# Patient Record
Sex: Male | Born: 1985 | Race: White | Hispanic: No | Marital: Married | Smoking: Current some day smoker
Health system: Southern US, Community
[De-identification: ages and names within clinical notes are randomized; demographics above are authoritative.]

## PROBLEM LIST (undated history)

## (undated) HISTORY — PX: HAND SURGERY: SHX662

---
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
## 2007-12-30 ENCOUNTER — Emergency Department (HOSPITAL_COMMUNITY): Admission: EM | Admit: 2007-12-30 | Discharge: 2007-12-30 | Payer: Self-pay | Admitting: Emergency Medicine

## 2008-10-02 ENCOUNTER — Emergency Department (HOSPITAL_COMMUNITY): Admission: EM | Admit: 2008-10-02 | Discharge: 2008-10-02 | Payer: Self-pay | Admitting: Emergency Medicine

## 2011-02-25 ENCOUNTER — Encounter: Payer: Self-pay | Admitting: *Deleted

## 2011-02-25 ENCOUNTER — Emergency Department (HOSPITAL_COMMUNITY)
Admission: EM | Admit: 2011-02-25 | Discharge: 2011-02-25 | Disposition: A | Discharge status: Home / Self Care | Payer: BC Managed Care – PPO | Attending: Emergency Medicine | Admitting: Emergency Medicine

## 2011-02-25 DIAGNOSIS — R21 Rash and other nonspecific skin eruption: Secondary | ICD-10-CM | POA: Insufficient documentation

## 2011-02-25 DIAGNOSIS — L508 Other urticaria: Secondary | ICD-10-CM

## 2011-02-25 MED ORDER — DIPHENHYDRAMINE HCL 25 MG PO CAPS
25.0000 mg | ORAL_CAPSULE | Freq: Once | ORAL | Status: AC
  Administered 2011-02-25: 25 mg via ORAL
  Filled 2011-02-25: qty 1

## 2011-02-25 MED ORDER — PREDNISONE 20 MG PO TABS
40.0000 mg | ORAL_TABLET | Freq: Once | ORAL | Status: AC
  Administered 2011-02-25: 40 mg via ORAL
  Filled 2011-02-25: qty 2

## 2011-02-25 NOTE — ED Notes (Addendum)
Pt states that he noticed a rash on both forearms starting yesterday on his arms.  Pt now has an itchy red rash over the entirety of his body.  Pt states that he has no allergies that he knows of that he may have come into contact with.  Pt complains also of joint aches and swelling.  Effected joints are knees, elbows, wrists, and ankles.

## 2011-02-25 NOTE — ED Provider Notes (Signed)
History     James Levy with rash. Gradual onset last night on b/l forearms. Has since spread to involve neck, trunk and thighs. Itches. No fever or chills. No abdominal pain or n/v/d. No respiratory complaints. No new exposures that pt is aware of. NO contacts with similar symptoms. Triage note reviewed but pt did not endorse joint pain or swelling to me.  CSN: [id number]  Arrival date & time 02/25/11  7829   First MD Initiated Contact with Patient 02/25/11 0759      Chief Complaint  Patient presents with  . Rash    (Consider location/radiation/quality/duration/timing/severity/associated sxs/prior treatment) HPI  History reviewed. No pertinent past medical history.  Past Surgical History  Procedure Date  . Hand surgery     "had screws put in my left hand"    Family History  Problem Relation Age of Onset  . Hyperlipidemia Mother   . Hyperlipidemia Father     History  Substance Use Topics  . Smoking status: Not on file  . Smokeless tobacco: Not on file  . Alcohol Use: 7.2 oz/week    12 Cans of beer per week      Review of Systems   Review of symptoms negative unless otherwise noted in HPI.   Allergies  Sulfa antibiotics and Suprax  Home Medications   Current Outpatient Rx  Name Route Sig Dispense Refill  . AMPHETAMINE-DEXTROAMPHETAMINE 30 MG PO TABS Oral Take 30 mg by mouth daily.        BP 112/73  Pulse 82  Temp(Src) 97.7 F (36.5 C) (Oral)  Resp 18  SpO2 100%  Physical Exam  Nursing note and vitals reviewed. Constitutional: He appears well-developed and well-nourished. No distress.  HENT:  Head: Normocephalic and atraumatic.  Eyes: Conjunctivae are normal. Right eye exhibits no discharge. Left eye exhibits no discharge.  Neck: Neck supple.  Cardiovascular: Normal rate, regular rhythm and normal heart sounds.  Exam reveals no gallop and no friction rub.   No murmur heard. Pulmonary/Chest: Effort normal and breath sounds normal. No stridor. No  respiratory distress. He has no wheezes.  Abdominal: Soft. He exhibits no distension. There is no tenderness.  Musculoskeletal: He exhibits no edema and no tenderness.  Lymphadenopathy:    He has no cervical adenopathy.  Neurological: He is alert.  Skin: Skin is warm and dry. Rash noted. He is not diaphoretic.       Diffuse erythematous rash. Consistent with urticaria. Small abrasions to b/l hands which pt attributes to minor trauma from work.   Psychiatric: He has a normal mood and affect. His behavior is normal. Thought content normal.    ED Course  Procedures (including critical care time)  Labs Reviewed - No data to display No results found.   1. Rash   2. Urticaria, acute       MDM  James Levy with rash. Consider contact dermatitis, allergic reaction, other. Suspect urticaria given morphology, migratory nature, prurititis. No new exposures indentified. No clinical evidence of anaphylaxis. Pt nontoxic. Plan symptomatic tx. Strict return precautions discussed. PCP fu as needed.        Raeford Razor, MD 02/25/11 [phone]

## 2017-01-25 ENCOUNTER — Emergency Department (HOSPITAL_BASED_OUTPATIENT_CLINIC_OR_DEPARTMENT_OTHER): Payer: Managed Care, Other (non HMO)

## 2017-01-25 ENCOUNTER — Other Ambulatory Visit: Payer: Self-pay

## 2017-01-25 ENCOUNTER — Encounter (HOSPITAL_BASED_OUTPATIENT_CLINIC_OR_DEPARTMENT_OTHER): Payer: Self-pay | Admitting: Emergency Medicine

## 2017-01-25 ENCOUNTER — Emergency Department (HOSPITAL_BASED_OUTPATIENT_CLINIC_OR_DEPARTMENT_OTHER)
Admission: EM | Admit: 2017-01-25 | Discharge: 2017-01-25 | Disposition: A | Discharge status: Home / Self Care | Payer: Managed Care, Other (non HMO) | Attending: Emergency Medicine | Admitting: Emergency Medicine

## 2017-01-25 DIAGNOSIS — S20211A Contusion of right front wall of thorax, initial encounter: Secondary | ICD-10-CM

## 2017-01-25 DIAGNOSIS — Y929 Unspecified place or not applicable: Secondary | ICD-10-CM | POA: Diagnosis not present

## 2017-01-25 DIAGNOSIS — Y999 Unspecified external cause status: Secondary | ICD-10-CM | POA: Insufficient documentation

## 2017-01-25 DIAGNOSIS — M25512 Pain in left shoulder: Secondary | ICD-10-CM | POA: Insufficient documentation

## 2017-01-25 DIAGNOSIS — F17210 Nicotine dependence, cigarettes, uncomplicated: Secondary | ICD-10-CM | POA: Insufficient documentation

## 2017-01-25 DIAGNOSIS — W14XXXA Fall from tree, initial encounter: Secondary | ICD-10-CM | POA: Diagnosis not present

## 2017-01-25 DIAGNOSIS — R51 Headache: Secondary | ICD-10-CM | POA: Diagnosis not present

## 2017-01-25 DIAGNOSIS — S299XXA Unspecified injury of thorax, initial encounter: Secondary | ICD-10-CM | POA: Diagnosis present

## 2017-01-25 DIAGNOSIS — M545 Low back pain: Secondary | ICD-10-CM | POA: Diagnosis not present

## 2017-01-25 DIAGNOSIS — Y9389 Activity, other specified: Secondary | ICD-10-CM | POA: Diagnosis not present

## 2017-01-25 DIAGNOSIS — Z79899 Other long term (current) drug therapy: Secondary | ICD-10-CM | POA: Insufficient documentation

## 2017-01-25 DIAGNOSIS — R1084 Generalized abdominal pain: Secondary | ICD-10-CM | POA: Diagnosis not present

## 2017-01-25 LAB — COMPREHENSIVE METABOLIC PANEL
ALT: 26 U/L (ref 17–63)
ANION GAP: 7 (ref 5–15)
AST: 33 U/L (ref 15–41)
Albumin: 4.8 g/dL (ref 3.5–5.0)
Alkaline Phosphatase: 55 U/L (ref 38–126)
BUN: 11 mg/dL (ref 6–20)
CHLORIDE: 105 mmol/L (ref 101–111)
CO2: 26 mmol/L (ref 22–32)
CREATININE: 0.96 mg/dL (ref 0.61–1.24)
Calcium: 9.8 mg/dL (ref 8.9–10.3)
Glucose, Bld: 87 mg/dL (ref 65–99)
Potassium: 3.5 mmol/L (ref 3.5–5.1)
SODIUM: 138 mmol/L (ref 135–145)
Total Bilirubin: 0.5 mg/dL (ref 0.3–1.2)
Total Protein: 8.2 g/dL — ABNORMAL HIGH (ref 6.5–8.1)

## 2017-01-25 LAB — URINALYSIS, ROUTINE W REFLEX MICROSCOPIC
Bilirubin Urine: NEGATIVE
GLUCOSE, UA: NEGATIVE mg/dL
Hgb urine dipstick: NEGATIVE
KETONES UR: NEGATIVE mg/dL
LEUKOCYTES UA: NEGATIVE
NITRITE: NEGATIVE
PH: 6.5 (ref 5.0–8.0)
Protein, ur: NEGATIVE mg/dL

## 2017-01-25 LAB — CBC
HCT: 41.9 % (ref 39.0–52.0)
HEMOGLOBIN: 14 g/dL (ref 13.0–17.0)
MCH: 29.3 pg (ref 26.0–34.0)
MCHC: 33.4 g/dL (ref 30.0–36.0)
MCV: 87.7 fL (ref 78.0–100.0)
PLATELETS: 256 10*3/uL (ref 150–400)
RBC: 4.78 MIL/uL (ref 4.22–5.81)
RDW: 13.3 % (ref 11.5–15.5)
WBC: 9.1 10*3/uL (ref 4.0–10.5)

## 2017-01-25 LAB — ETHANOL: ALCOHOL ETHYL (B): 10 mg/dL — AB (ref <10)

## 2017-01-25 LAB — I-STAT CG4 LACTIC ACID, ED: Lactic Acid, Venous: 1.26 mmol/L (ref 0.5–1.9)

## 2017-01-25 LAB — PROTIME-INR
INR: 0.97
PROTHROMBIN TIME: 12.8 s (ref 11.4–15.2)

## 2017-01-25 MED ORDER — SODIUM CHLORIDE 0.9 % IV SOLN: INTRAVENOUS | Status: DC

## 2017-01-25 MED ORDER — IOPAMIDOL (ISOVUE-300) INJECTION 61%
100.0000 mL | Freq: Once | INTRAVENOUS | Status: AC | PRN
  Administered 2017-01-25: 100 mL via INTRAVENOUS

## 2017-01-25 MED ORDER — FENTANYL CITRATE (PF) 100 MCG/2ML IJ SOLN
50.0000 ug | Freq: Once | INTRAMUSCULAR | Status: AC
  Administered 2017-01-25: 50 ug via INTRAVENOUS
  Filled 2017-01-25: qty 2

## 2017-01-25 MED ORDER — SODIUM CHLORIDE 0.9 % IV BOLUS (SEPSIS)
1000.0000 mL | Freq: Once | INTRAVENOUS | Status: AC
  Administered 2017-01-25: 1000 mL via INTRAVENOUS

## 2017-01-25 NOTE — ED Notes (Signed)
Patient transported to CT 

## 2017-01-25 NOTE — ED Triage Notes (Signed)
Pt reports he fell 15 feet out of a tree, landed on his back and R shoulder hit a root. Pt denies LOC. Pt c/o back, shoulder and rib pain to R side. Pt placed in c collar at triage.

## 2017-01-25 NOTE — ED Notes (Signed)
Pt on cardiac monitor and auto VS 

## 2017-01-26 NOTE — ED Provider Notes (Signed)
[facility] EMERGENCY DEPARTMENT Provider Note   CSN: [id number] Arrival date & time: 01/25/17  1721     History   Chief Complaint Chief Complaint  Patient presents with  . Fall    HPI James Levy is a 31 y.o. male.  HPI 31 year old Caucasian male with no pertinent past medical history presents to the ED after falling out of a 15 foot tree and landing on his back.  Patient also reports hitting his left shoulder on one at the tree branches.  Patient denies LOC or head injury.  Patient complains of pain specifically to his back, left shoulder and right side of his abdomen and chest.  Patient was placed in a c-collar at triage.  Patient has been ambulatory since the event.  He has not taking for the pain prior to arrival.  Movement and palpation make the pain worse.  Nothing makes the pain better.  Patient denies any associated headache, vision changes, lightheadedness, dizziness, shortness of breath, nausea, emesis, change in bowel habits, urinary symptoms. Pt denies any ha, night sweats, hx of ivdu/cancer, loss or bowel or bladder, urinary retention, saddle paresthesias, lower extremity paresthesias.   History reviewed. No pertinent past medical history.  There are no active problems to display for this patient.   Past Surgical History:  Procedure Laterality Date  . HAND SURGERY     "had screws put in my left hand"       Home Medications    Prior to Admission medications   Medication Sig Start Date End Date Taking? Authorizing Provider  amphetamine-dextroamphetamine (ADDERALL) 30 MG tablet Take 30 mg by mouth daily.      [provider]    Family History Family History  Problem Relation Age of Onset  . Hyperlipidemia Mother   . Hyperlipidemia Father     Social History Social History   Tobacco Use  . Smoking status: Current Some Day Smoker  . Smokeless tobacco: Never Used  Substance Use Topics  . Alcohol use: Yes    Alcohol/week: 7.2 oz     Types: 12 Cans of beer per week  . Drug use: No     Allergies   Cefixime and Sulfa antibiotics   Review of Systems Review of Systems  Constitutional: Negative for chills and fever.  HENT: Negative for congestion and sore throat.   Eyes: Negative for visual disturbance.  Respiratory: Negative for cough and shortness of breath.   Cardiovascular: Positive for chest pain (right frib pain).  Gastrointestinal: Positive for abdominal pain (right side). Negative for diarrhea, nausea and vomiting.  Genitourinary: Negative for dysuria, flank pain, frequency, hematuria, scrotal swelling, testicular pain and urgency.  Musculoskeletal: Positive for arthralgias, back pain, joint swelling and myalgias. Negative for gait problem, neck pain and neck stiffness.  Skin: Negative for rash.  Neurological: Negative for dizziness, syncope, weakness, light-headedness, numbness and headaches.  Psychiatric/Behavioral: Negative for sleep disturbance. The patient is not nervous/anxious.      Physical Exam Updated Vital Signs BP 135/81   Pulse 82   Temp 98.7 F (37.1 C) (Oral)   Resp 15   Ht 6' (1.829 m)   Wt 81.6 kg (180 lb)   SpO2 100%   BMI 24.41 kg/m   Physical Exam Physical Exam  Constitutional: Pt is oriented to person, place, and time. Appears well-developed and well-nourished. No distress.  HENT:  Head: Normocephalic and atraumatic.  Ears: No bilateral hemotympanum. Nose: Nose normal. No septal hematoma. Mouth/Throat: Uvula is midline, oropharynx  is clear and moist and mucous membranes are normal.  Eyes: Conjunctivae and EOM are normal. Pupils are equal, round, and reactive to light.  Neck: No spinous process tenderness and no muscular tenderness present. No rigidity. Normal range of motion present.    Patient in c-collar at this time unable to assess range of motion. No midline cervical tenderness No crepitus, deformity or step-offs No paraspinal tenderness  Cardiovascular: Normal  rate, regular rhythm and intact distal pulses.   Pulses:      Radial pulses are 2+ on the right side, and 2+ on the left side.       Dorsalis pedis pulses are 2+ on the right side, and 2+ on the left side.       Posterior tibial pulses are 2+ on the right side, and 2+ on the left side.  Pulmonary/Chest: Effort normal and breath sounds normal. No accessory muscle usage. No respiratory distress. No decreased breath sounds. No wheezes. No rhonchi. No rales. Exhibits tenderness and  bony tenderness over the right lateral chest.  No flail segment, crepitus or deformity Equal chest expansion  Abdominal: Soft. Normal appearance and bowel sounds are normal. There is mild right upper quadrant abdominal tenderness palpation but no other focal abdominal tenderness. There is no rigidity, no guarding and no CVA tenderness.  No ecchymosis noted. Abd soft and nontender  Musculoskeletal: Normal range of motion.       Thoracic back: Exhibits normal range of motion.       Lumbar back: Exhibits normal range of motion.  Full range of motion of the T-spine and L-spine  tenderness to palpation of the spinous processes of the T-spine or L-spine No crepitus, deformity or step-offs Mild tenderness to palpation of the paraspinous muscles of the L-spine  Pelvis is stable.  Full range motion of lower extremities without any sensation loss. Lymphadenopathy:    Pt has no cervical adenopathy.  Neurological: Pt is alert and oriented to person, place, and time. Normal reflexes. No cranial nerve deficit. GCS eye subscore is 4. GCS verbal subscore is 5. GCS motor subscore is 6.  Reflex Scores:      Bicep reflexes are 2+ on the right side and 2+ on the left side.      Brachioradialis reflexes are 2+ on the right side and 2+ on the left side.      Patellar reflexes are 2+ on the right side and 2+ on the left side.      Achilles reflexes are 2+ on the right side and 2+ on the left side. Speech is clear and goal oriented,  follows commands Normal 5/5 strength in upper and lower extremities bilaterally including dorsiflexion and plantar flexion, strong and equal grip strength Sensation normal to light and sharp touch Moves extremities without ataxia, coordination intact No Clonus  Skin: Skin is warm and dry. No rash noted. Pt is not diaphoretic. No erythema.  Psychiatric: Normal mood and affect.  Nursing note and vitals reviewed.     ED Treatments / Results  Labs (all labs ordered are listed, but only abnormal results are displayed) Labs Reviewed  COMPREHENSIVE METABOLIC PANEL - Abnormal; Notable for the following components:      Result Value   Total Protein 8.2 (*)    All other components within normal limits  ETHANOL - Abnormal; Notable for the following components:   Alcohol, Ethyl (B) 10 (*)    All other components within normal limits  URINALYSIS, ROUTINE W REFLEX MICROSCOPIC - Abnormal;  Notable for the following components:   Specific Gravity, Urine <1.005 (*)    All other components within normal limits  CBC  PROTIME-INR  CDS SEROLOGY  I-STAT CG4 LACTIC ACID, ED    EKG  EKG Interpretation None       Radiology Ct Head Wo Contrast  Result Date: 01/25/2017 CLINICAL DATA:  Patient fell 15 from a tree.  Head and neck pain. EXAM: CT HEAD WITHOUT CONTRAST CT CERVICAL SPINE WITHOUT CONTRAST TECHNIQUE: Multidetector CT imaging of the head and cervical spine was performed following the standard protocol without intravenous contrast. Multiplanar CT image reconstructions of the cervical spine were also generated. COMPARISON:  None. FINDINGS: CT HEAD FINDINGS BRAIN: The ventricles and sulci are normal. No intraparenchymal hemorrhage, mass effect nor midline shift. No acute large vascular territory infarcts. No abnormal extra-axial fluid collections. Basal cisterns are midline and not effaced. No acute cerebellar abnormality. Post processing imaging algorithm artifacts accounting for hypodensities  in both occipital lobes. No overlying soft tissue contusion or swelling is seen. VASCULAR: Unremarkable. SKULL/SOFT TISSUES: No skull fracture. No significant soft tissue swelling. ORBITS/SINUSES: The included ocular globes and orbital contents are normal.The mastoid air-cells and included paranasal sinuses are well-aerated. OTHER: None. CT CERVICAL SPINE FINDINGS ALIGNMENT: Vertebral bodies in alignment. Maintained lordosis. SKULL BASE AND VERTEBRAE: Cervical vertebral bodies and posterior elements are intact. Intervertebral disc heights preserved. No destructive bony lesions. C1-2 articulation maintained. SOFT TISSUES AND SPINAL CANAL: Normal. DISC LEVELS: No significant osseous canal stenosis or neural foraminal narrowing. UPPER CHEST: Lung apices are clear. OTHER: None. IMPRESSION: No acute intracranial nor cervical spine abnormality. Electronically Signed   By: Tollie Eth M.D.   On: 01/25/2017 18:51   Ct Chest W Contrast  Result Date: 01/25/2017 CLINICAL DATA:  Fall 15 feet from tree, rib pain and shortness of breath. EXAM: CT CHEST, ABDOMEN, AND PELVIS WITH CONTRAST TECHNIQUE: Multidetector CT imaging of the chest, abdomen and pelvis was performed following the standard protocol during bolus administration of intravenous contrast. CONTRAST:  ISOVUE-300 IOPAMIDOL (ISOVUE-300) INJECTION 61% COMPARISON:  Radiographs earlier this day. FINDINGS: CT CHEST FINDINGS Cardiovascular: No acute aortic injury. Heart is normal in size. No pericardial fluid. Mediastinum/Nodes: Clustered mediastinal vessels in the anterior mediastinum, no frank mediastinal hematoma or hemorrhage. No adenopathy. No pneumomediastinum. The esophagus is decompressed. Lungs/Pleura: No pneumothorax. No consolidation to suggest pulmonary contusion. No pleural fluid. Tiny subpleural nodularity in the right middle lobe likely an intrapulmonary lymph node. Musculoskeletal: No fracture of the ribs, sternum, included clavicles or shoulder  girdles. No acute fracture of the thoracic spine. Scattered Schmorl's nodes. CT ABDOMEN PELVIS FINDINGS Hepatobiliary: No hepatic injury or perihepatic hematoma. Gallbladder is unremarkable Pancreas: No pancreatic injury. Homogeneous enhancement. No ductal dilatation or inflammation. Spleen: No splenic injury or perisplenic hematoma. Adrenals/Urinary Tract: No adrenal hemorrhage or renal injury identified. Small cortical cleft in mid left kidney with adjacent subcentimeter low-density lesion, likely small cysts but too small to characterize. Bladder is unremarkable. Stomach/Bowel: No evidence of bowel or mesenteric injury. No bowel wall thickening or inflammation. No mesenteric hematoma or fluid. Vascular/Lymphatic: No vascular injury. The abdominal aorta and IVC are intact. No retroperitoneal fluid. 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 Reproductive: Prostate is unremarkable. Other: No free fluid or free air. Musculoskeletal: No fracture of the lumbar spine or bony pelvis. Hemi transitional lumbosacral anatomy is incidentally noted. IMPRESSION: No acute traumatic injury to the chest, abdomen, or pelvis. Electronically Signed   By: Rubye Oaks M.D.   On:  01/25/2017 18:59   Ct Cervical Spine Wo Contrast  Result Date: 01/25/2017 CLINICAL DATA:  Patient fell 15 from a tree.  Head and neck pain. EXAM: CT HEAD WITHOUT CONTRAST CT CERVICAL SPINE WITHOUT CONTRAST TECHNIQUE: Multidetector CT imaging of the head and cervical spine was performed following the standard protocol without intravenous contrast. Multiplanar CT image reconstructions of the cervical spine were also generated. COMPARISON:  None. FINDINGS: CT HEAD FINDINGS BRAIN: The ventricles and sulci are normal. No intraparenchymal hemorrhage, mass effect nor midline shift. No acute large vascular territory infarcts. No abnormal extra-axial fluid collections. Basal cisterns are midline and not effaced. No acute cerebellar abnormality. Post  processing imaging algorithm artifacts accounting for hypodensities in both occipital lobes. No overlying soft tissue contusion or swelling is seen. VASCULAR: Unremarkable. SKULL/SOFT TISSUES: No skull fracture. No significant soft tissue swelling. ORBITS/SINUSES: The included ocular globes and orbital contents are normal.The mastoid air-cells and included paranasal sinuses are well-aerated. OTHER: None. CT CERVICAL SPINE FINDINGS ALIGNMENT: Vertebral bodies in alignment. Maintained lordosis. SKULL BASE AND VERTEBRAE: Cervical vertebral bodies and posterior elements are intact. Intervertebral disc heights preserved. No destructive bony lesions. C1-2 articulation maintained. SOFT TISSUES AND SPINAL CANAL: Normal. DISC LEVELS: No significant osseous canal stenosis or neural foraminal narrowing. UPPER CHEST: Lung apices are clear. OTHER: None. IMPRESSION: No acute intracranial nor cervical spine abnormality. Electronically Signed   By: Tollie Eth M.D.   On: 01/25/2017 18:51   Ct Abdomen Pelvis W Contrast  Result Date: 01/25/2017 CLINICAL DATA:  Fall 15 feet from tree, rib pain and shortness of breath. EXAM: CT CHEST, ABDOMEN, AND PELVIS WITH CONTRAST TECHNIQUE: Multidetector CT imaging of the chest, abdomen and pelvis was performed following the standard protocol during bolus administration of intravenous contrast. CONTRAST:  ISOVUE-300 IOPAMIDOL (ISOVUE-300) INJECTION 61% COMPARISON:  Radiographs earlier this day. FINDINGS: CT CHEST FINDINGS Cardiovascular: No acute aortic injury. Heart is normal in size. No pericardial fluid. Mediastinum/Nodes: Clustered mediastinal vessels in the anterior mediastinum, no frank mediastinal hematoma or hemorrhage. No adenopathy. No pneumomediastinum. The esophagus is decompressed. Lungs/Pleura: No pneumothorax. No consolidation to suggest pulmonary contusion. No pleural fluid. Tiny subpleural nodularity in the right middle lobe likely an intrapulmonary lymph node.  Musculoskeletal: No fracture of the ribs, sternum, included clavicles or shoulder girdles. No acute fracture of the thoracic spine. Scattered Schmorl's nodes. CT ABDOMEN PELVIS FINDINGS Hepatobiliary: No hepatic injury or perihepatic hematoma. Gallbladder is unremarkable Pancreas: No pancreatic injury. Homogeneous enhancement. No ductal dilatation or inflammation. Spleen: No splenic injury or perisplenic hematoma. Adrenals/Urinary Tract: No adrenal hemorrhage or renal injury identified. Small cortical cleft in mid left kidney with adjacent subcentimeter low-density lesion, likely small cysts but too small to characterize. Bladder is unremarkable. Stomach/Bowel: No evidence of bowel or mesenteric injury. No bowel wall thickening or inflammation. No mesenteric hematoma or fluid. Vascular/Lymphatic: No vascular injury. The abdominal aorta and IVC are intact. No retroperitoneal fluid. 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 Reproductive: Prostate is unremarkable. Other: No free fluid or free air. Musculoskeletal: No fracture of the lumbar spine or bony pelvis. Hemi transitional lumbosacral anatomy is incidentally noted. IMPRESSION: No acute traumatic injury to the chest, abdomen, or pelvis. Electronically Signed   By: Rubye Oaks M.D.   On: 01/25/2017 18:59   Dg Pelvis Portable  Result Date: 01/25/2017 CLINICAL DATA:  31 year old male status post fall from 15 feet from ladder. EXAM: PORTABLE PELVIS 1-2 VIEWS COMPARISON:  None. FINDINGS: There is no evidence of pelvic fracture or diastasis. No  pelvic bone lesions are seen. IMPRESSION: Negative. Electronically Signed   By: Sande BrothersSerena  Chacko M.D.   On: 01/25/2017 18:00   Dg Chest Portable 1 View  Result Date: 01/25/2017 CLINICAL DATA:  31 year old male status post fall from 15 feet Tom ladder. EXAM: PORTABLE CHEST 1 VIEW COMPARISON:  None. FINDINGS: The heart size and mediastinal contours are within normal limits. Both lungs are clear. The visualized  skeletal structures are unremarkable. No definite rib fractures identified. IMPRESSION: 1. No active cardiopulmonary disease. 2. No definite rib fractures within the limits of the study. Consider dedicated plain films of the ribs, if there is high clinical suspicion for rib fractures. Electronically Signed   By: Sande BrothersSerena  Chacko M.D.   On: 01/25/2017 17:49    Procedures Procedures (including critical care time)  Medications Ordered in ED Medications  sodium chloride 0.9 % bolus 1,000 mL (0 mLs Intravenous Stopped 01/25/17 2001)  fentaNYL (SUBLIMAZE) injection 50 mcg (50 mcg Intravenous Given 01/25/17 1853)  iopamidol (ISOVUE-300) 61 % injection 100 mL (100 mLs Intravenous Contrast Given 01/25/17 1805)     Initial Impression / Assessment and Plan / ED Course  I have reviewed the triage vital signs and the nursing notes.  Pertinent labs & imaging results that were available during my care of the patient were reviewed by me and considered in my medical decision making (see chart for details).     Patient presents to the ED for evaluation of pain after falling out of a 15 foot tree.  Patient denies head injury or LOC.  Pelvis is stable on exam with good lung sounds.  No obvious open fractures, ecchymosis, erythema.  Portable chest and pelvis was unremarkable and showed no acute findings.  Patient was placed in c-collar given the mechanism.  Patient's vital signs were reassuring.  No hypotension or tachycardia.  Trauma code was not initiated given normal vital signs.  Lab work is been reassuring.  Patient's alcohol level is 10.  Pan scan was performed given mechanism of injury.  CT scan showed no acute intracranial, intrathoracic, intra-abdominal, intrapelvic pathology.  C-collar was removed.  Patient's pain managed in the ED.  Patient vital signs remained reassuring.  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  She did not want us to discharge patient home with  any narcotic pain medicine.  She was okay with giving him a dose of IV pain medicine in the ED.  Patient given fentanyl with improvement in his pain.  She is able to tolerate p.o. fluids.  Able to ambulate with normal gait.   Pt is hemodynamically stable, in NAD, & able to ambulate in the ED. Evaluation does not show pathology that would require ongoing emergent intervention or inpatient treatment. I explained the diagnosis to the patient. Pain has been managed & has no complaints prior to dc. Pt is comfortable with above plan and is stable for discharge at this time. All questions were answered prior to disposition. Strict return precautions for f/u to the ED were discussed. Encouraged follow up with PCP.   Final Clinical Impressions(s) / ED Diagnoses   Final diagnoses:  Fall from tree, initial encounter  Contusion of rib on right side, initial encounter  Acute pain of left shoulder    ED Discharge Orders    None       Wallace KellerLeaphart, Kenneth T, PA-C 01/26/17 1551    Alvira MondaySchlossman, Erin, MD 01/27/17 [phone]

## 2018-09-19 ENCOUNTER — Emergency Department (HOSPITAL_BASED_OUTPATIENT_CLINIC_OR_DEPARTMENT_OTHER): Payer: PRIVATE HEALTH INSURANCE

## 2018-09-19 ENCOUNTER — Encounter (HOSPITAL_BASED_OUTPATIENT_CLINIC_OR_DEPARTMENT_OTHER): Payer: Self-pay | Admitting: Emergency Medicine

## 2018-09-19 ENCOUNTER — Other Ambulatory Visit: Payer: Self-pay

## 2018-09-19 ENCOUNTER — Emergency Department (HOSPITAL_BASED_OUTPATIENT_CLINIC_OR_DEPARTMENT_OTHER)
Admission: EM | Admit: 2018-09-19 | Discharge: 2018-09-19 | Disposition: A | Discharge status: Home / Self Care | Payer: PRIVATE HEALTH INSURANCE | Attending: Emergency Medicine | Admitting: Emergency Medicine

## 2018-09-19 DIAGNOSIS — R51 Headache: Secondary | ICD-10-CM | POA: Insufficient documentation

## 2018-09-19 DIAGNOSIS — Z79899 Other long term (current) drug therapy: Secondary | ICD-10-CM | POA: Diagnosis not present

## 2018-09-19 DIAGNOSIS — R110 Nausea: Secondary | ICD-10-CM | POA: Diagnosis not present

## 2018-09-19 DIAGNOSIS — F17200 Nicotine dependence, unspecified, uncomplicated: Secondary | ICD-10-CM | POA: Diagnosis not present

## 2018-09-19 DIAGNOSIS — R519 Headache, unspecified: Secondary | ICD-10-CM

## 2018-09-19 MED ORDER — KETOROLAC TROMETHAMINE 15 MG/ML IJ SOLN
15.0000 mg | Freq: Once | INTRAMUSCULAR | Status: AC
  Administered 2018-09-19: 15 mg via INTRAVENOUS
  Filled 2018-09-19: qty 1

## 2018-09-19 MED ORDER — DEXAMETHASONE SODIUM PHOSPHATE 10 MG/ML IJ SOLN
10.0000 mg | Freq: Once | INTRAMUSCULAR | Status: AC
  Administered 2018-09-19: 15:00:00 10 mg via INTRAVENOUS
  Filled 2018-09-19: qty 1

## 2018-09-19 MED ORDER — DIPHENHYDRAMINE HCL 50 MG/ML IJ SOLN
25.0000 mg | Freq: Once | INTRAMUSCULAR | Status: AC
  Administered 2018-09-19: 25 mg via INTRAVENOUS
  Filled 2018-09-19: qty 1

## 2018-09-19 MED ORDER — SODIUM CHLORIDE 0.9 % IV BOLUS
1000.0000 mL | Freq: Once | INTRAVENOUS | Status: AC
  Administered 2018-09-19: 1000 mL via INTRAVENOUS

## 2018-09-19 NOTE — ED Notes (Signed)
Pt reports his HA is a 2/10 while lying down. Pt ambulated and pt stated HA increased to 5/10. EDP notified.

## 2018-09-19 NOTE — ED Triage Notes (Signed)
Pt had injections in his back on Wednesday. Reports headache since Thursday.

## 2018-09-19 NOTE — ED Provider Notes (Signed)
[facility] EMER[facility]ENCY DEPARTMENT Provider Note   CSN: [id number] Arrival date & time: 09/19/18  1327    History   Chief Complaint Chief Complaint  Patient presents with  . Headache    HPI James Levy is a 33 y.o. male.     33yo male with complaint of global headache onset Thursday following a low back injection for right sided SI pain. Reports feeling nauseous with standing, pain improves somewhat with lying down. Also pain to posterior neck, also had injections to trapezius area (left and right) last Wednesday.  Denies fevers, chills, sweats, vomiting. Patient has tried Maxalt, Fioricet, tylenol, ibu with temporary relief. No history of prior headaches, Maxalt an Fioricet are prescribed for someone else. No difficulty walking, no changes in vision or speech, weakness in arms or legs. Sees [facility], Dr. Trula Ore.      History reviewed. No pertinent past medical history.  There are no active problems to display for this patient.   Past Surgical History:  Procedure Laterality Date  . HAND SURGERY     "had screws put in my left hand"        Home Medications    Prior to Admission medications   Medication Sig Start Date End Date Taking? Authorizing Provider  amphetamine-dextroamphetamine (ADDERALL) 30 MG tablet Take 30 mg by mouth daily.      [provider]    Family History Family History  Problem Relation Age of Onset  . Hyperlipidemia Mother   . Hyperlipidemia Father     Social History Social History   Tobacco Use  . Smoking status: Current Some Day Smoker  . Smokeless tobacco: Never Used  Substance Use Topics  . Alcohol use: Yes    Alcohol/week: 12.0 standard drinks    Types: 12 Cans of beer per week  . Drug use: Yes    Types: Marijuana     Allergies   Cefixime and Sulfa antibiotics   Review of Systems Review of Systems  Constitutional: Negative for chills, diaphoresis and fever.  HENT: Negative for  congestion.   Eyes: Positive for photophobia. Negative for visual disturbance.  Respiratory: Negative for cough.   Gastrointestinal: Positive for nausea. Negative for abdominal pain, constipation, diarrhea and vomiting.  Musculoskeletal: Positive for neck pain.  Skin: Negative for rash and wound.  Allergic/Immunologic: Negative for immunocompromised state.  Neurological: Positive for headaches. Negative for dizziness, speech difficulty, weakness and numbness.  Hematological: Negative for adenopathy. Does not bruise/bleed easily.  Psychiatric/Behavioral: Negative for confusion.  All other systems reviewed and are negative.    Physical Exam Updated Vital Signs BP (!) 114/59   Pulse (!) 53   Temp 98.3 F (36.8 C) (Oral)   Resp 18   Ht 6' (1.829 m)   Wt 72.6 kg   SpO2 100%   BMI 21.70 kg/m   Physical Exam Vitals signs and nursing note reviewed.  Constitutional:      General: He is not in acute distress.    Appearance: He is well-developed. He is not diaphoretic.  HENT:     Head: Normocephalic and atraumatic.  Eyes:     General: No visual field deficit.    Extraocular Movements: Extraocular movements intact.     Pupils: Pupils are equal, round, and reactive to light.  Neck:     Comments: Able to turn head left to right, reports low back pain with chin to chest Pulmonary:     Effort: Pulmonary effort is normal.  Musculoskeletal:  Cervical back: He exhibits no tenderness.     Thoracic back: He exhibits no bony tenderness.     Lumbar back: He exhibits no bony tenderness.       Back:  Skin:    General: Skin is warm and dry.     Findings: No erythema or rash.  Neurological:     General: No focal deficit present.     Mental Status: He is alert and oriented to person, place, and time.     GCS: GCS eye subscore is 4. GCS verbal subscore is 5. GCS motor subscore is 6.     Cranial Nerves: No cranial nerve deficit, dysarthria or facial asymmetry.     Sensory: Sensation is  intact.     Motor: Motor function is intact. No weakness.     Coordination: Coordination is intact.  Psychiatric:        Behavior: Behavior normal.      ED Treatments / Results  Labs (all labs ordered are listed, but only abnormal results are displayed) Labs Reviewed - No data to display  EKG None  Radiology Ct Head Wo Contrast  Result Date: 09/19/2018 CLINICAL DATA:  Headache. EXAM: CT HEAD WITHOUT CONTRAST TECHNIQUE: Contiguous axial images were obtained from the base of the skull through the vertex without intravenous contrast. COMPARISON:  CT scan of January 25, 2017. FINDINGS: Brain: No evidence of acute infarction, hemorrhage, hydrocephalus, extra-axial collection or mass lesion/mass effect. Vascular: No hyperdense vessel or unexpected calcification. Skull: Normal. Negative for fracture or focal lesion. Sinuses/Orbits: No acute finding. Other: None. IMPRESSION: Normal head CT. Electronically Signed   By: Lupita RaiderJames  Green Jr M.D.   On: 09/19/2018 14:48    Procedures Procedures (including critical care time)  Medications Ordered in ED Medications  sodium chloride 0.9 % bolus 1,000 mL (0 mLs Intravenous Stopped 09/19/18 1613)  ketorolac (TORADOL) 15 MG/ML injection 15 mg (15 mg Intravenous Given 09/19/18 1456)  dexamethasone (DECADRON) injection 10 mg (10 mg Intravenous Given 09/19/18 1456)  diphenhydrAMINE (BENADRYL) injection 25 mg (25 mg Intravenous Given 09/19/18 1456)     Initial Impression / Assessment and Plan / ED Course  I have reviewed the triage vital signs and the nursing notes.  Pertinent labs & imaging results that were available during my care of the patient were reviewed by me and considered in my medical decision making (see chart for details).  Clinical Course as of Sep 18 1699  Sat Sep 19, 2018  [id number] 33 year old male presents with complaint of headache and nausea after injections in his back 3 days ago.  CT of the head is normal.  Patient was given IV fluids  with Toradol, Decadron, Benadryl.  Patient is feeling better, able to sit up without return of his headache.  Plan is to ambulate patient and discharged home to follow-up with his provider.   [LM]    Clinical Course User Index [LM] Jeannie FendMurphy, Kelise Kuch A, PA-C      Final Clinical Impressions(s) / ED Diagnoses   Final diagnoses:  Acute nonintractable headache, unspecified headache type    ED Discharge Orders    None       Jeannie FendMurphy, Brittany Osier A, PA-C 09/19/18 1701    Sabas SousBero, Michael M, MD 09/20/18 [phone]

## 2018-09-19 NOTE — Discharge Instructions (Signed)
Follow-up with your provider on Monday, return to ER for new or worsening symptoms. Home to rest, follow discharge instructions for headache, be sure to drink plenty of fluids and stay hydrated.

## 2020-01-17 IMAGING — CT CT HEAD WITHOUT CONTRAST
3 series · 16 of 47 positions shown, 19 images · non-contrast
Comparison: CT scan of January 25, 2017.

CLINICAL DATA: Headache.

EXAM:
CT HEAD WITHOUT CONTRAST
TECHNIQUE: Contiguous axial images were obtained from the base of the skull
through the vertex without intravenous contrast.

[Series 2: head wo · axial · 0.48mm/px · z∈[-173,-23]mm · 10 of 36 slices shown, 13 images]
[im 3/36  brain]
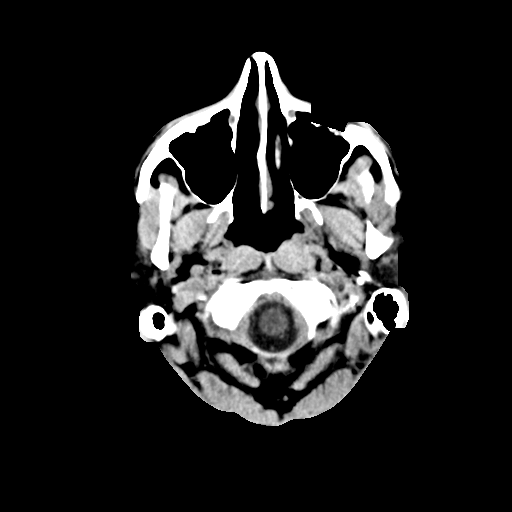
[im 3/36  bone]
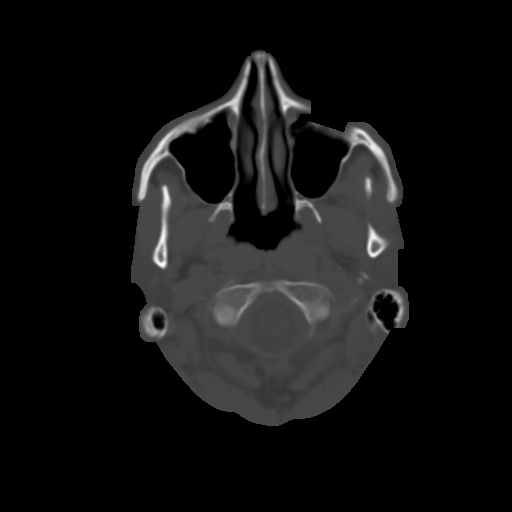
[im 7/36  brain]
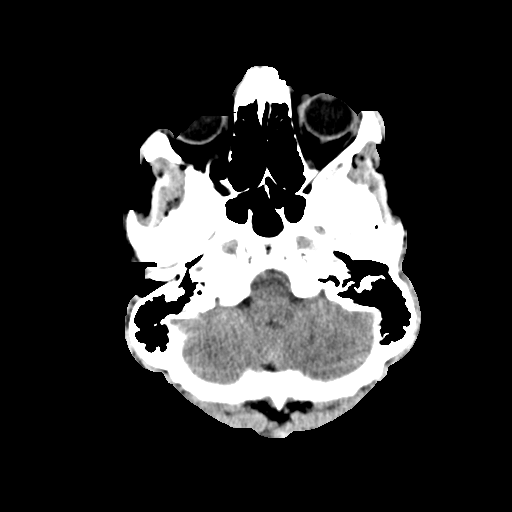
[im 10/36  brain]
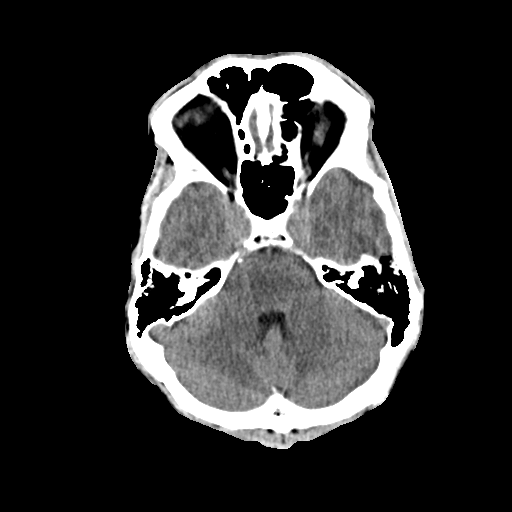
[im 13/36  brain]
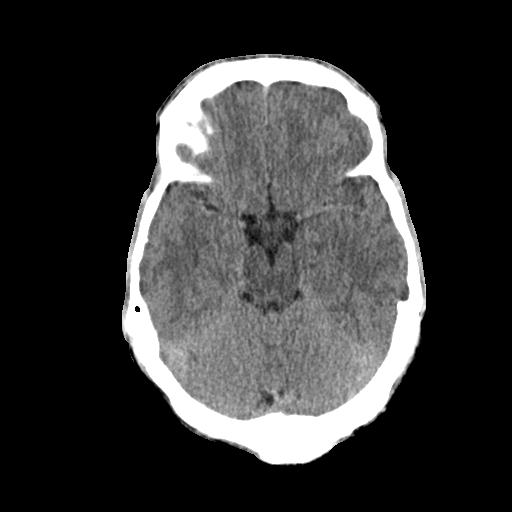
[im 16/36  brain]
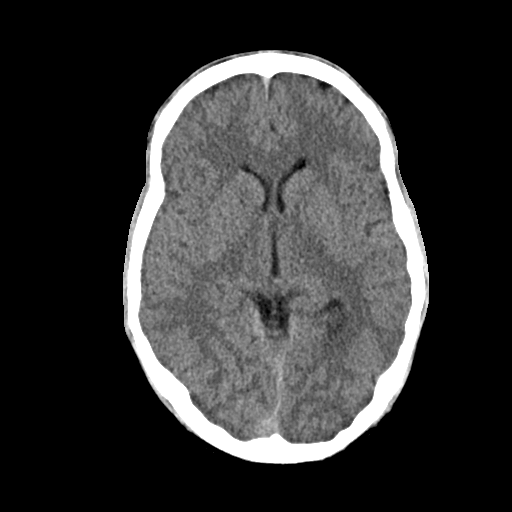
[im 16/36  bone]
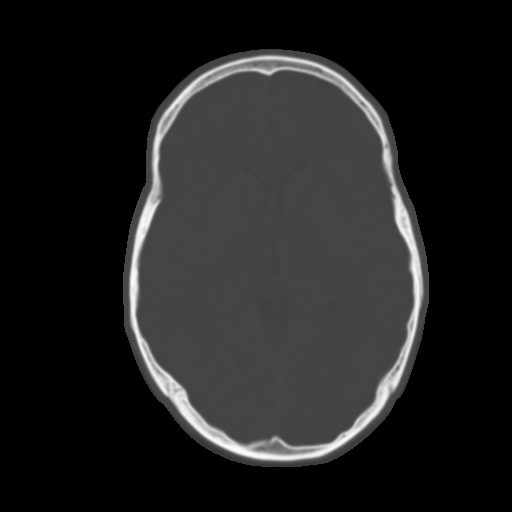
[im 20/36  brain]
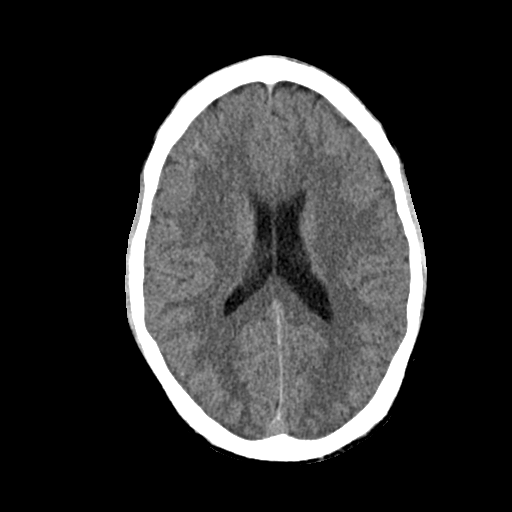
[im 23/36  brain]
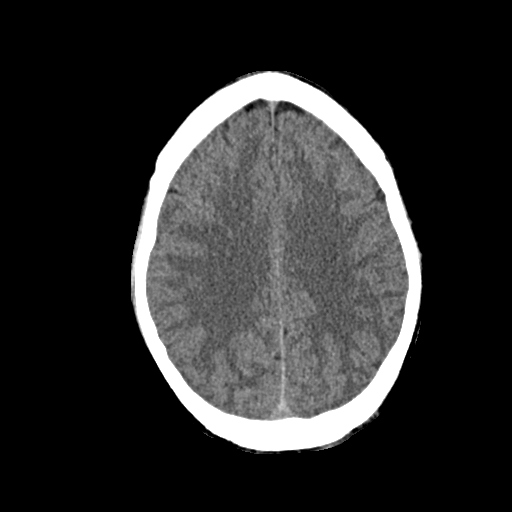
[im 27/36  brain]
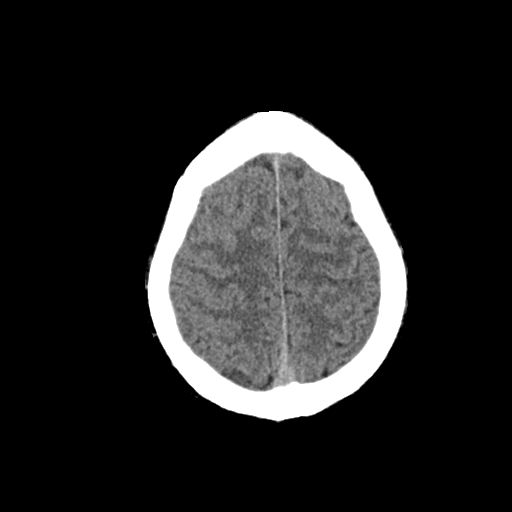
[im 29/36  brain]
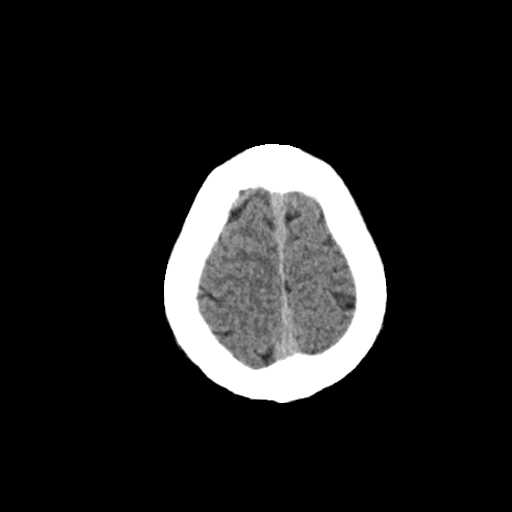
[im 29/36  bone]
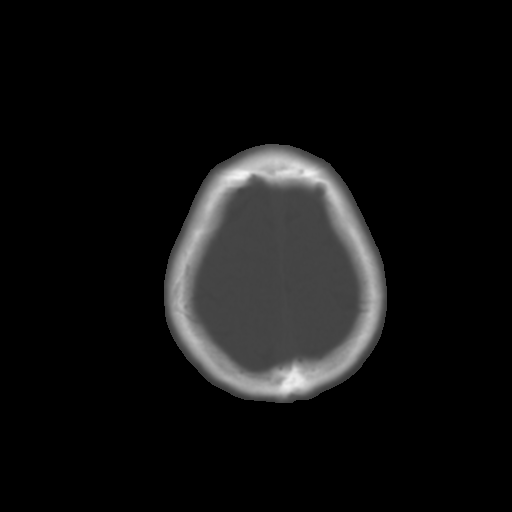
[im 33/36  brain]
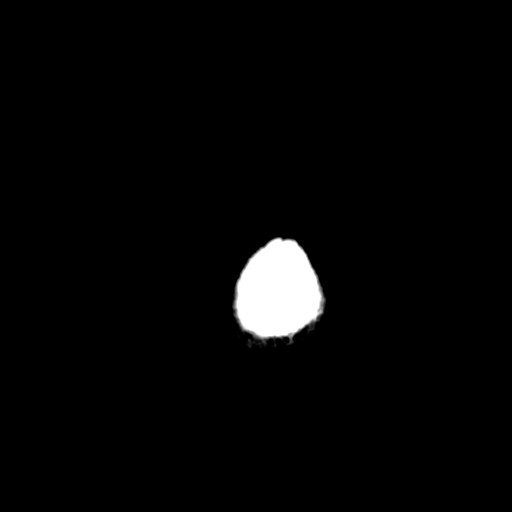

[Series 4: coronal soft · coronal · 0.39mm/px · 3 of 72 slices shown]
[im 24/72  brain]
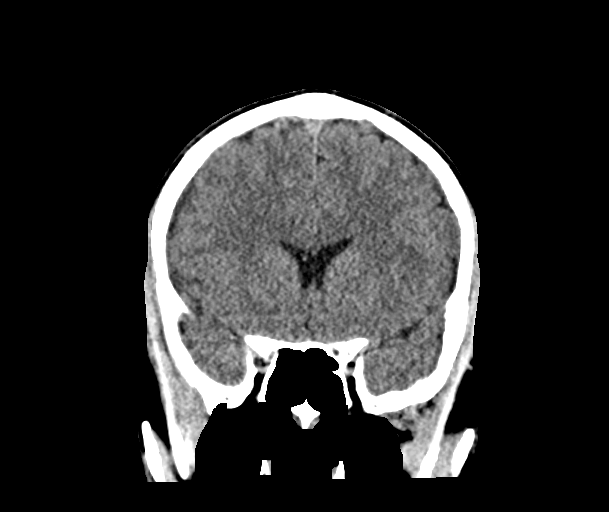
[im 32/72  brain]
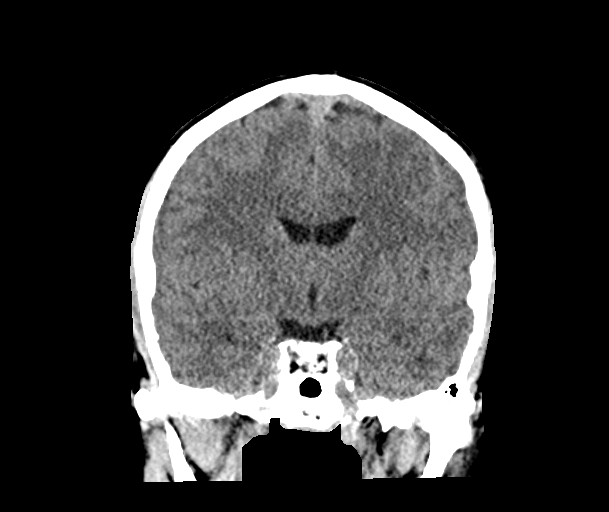
[im 40/72  brain]
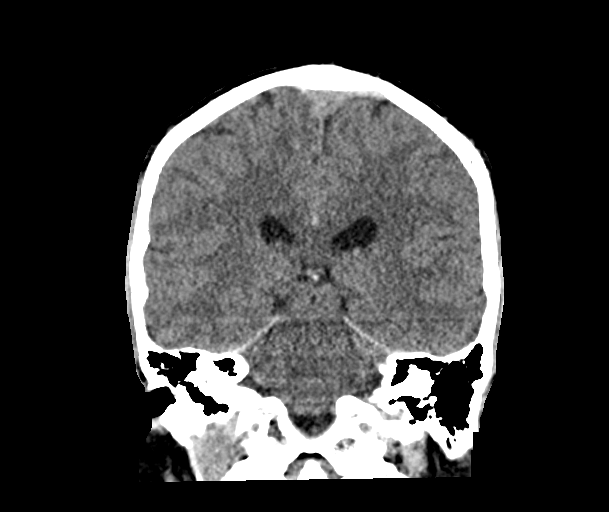

[Series 5: sag soft · sagittal · 0.40mm/px · 3 of 80 slices shown]
[im 27/80  brain]
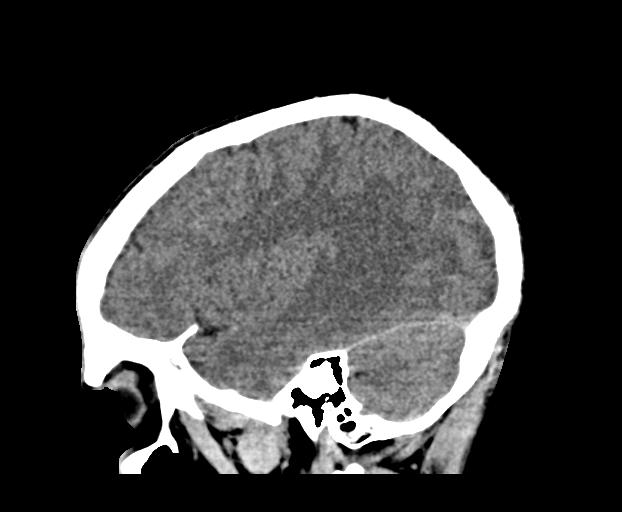
[im 40/80  brain]
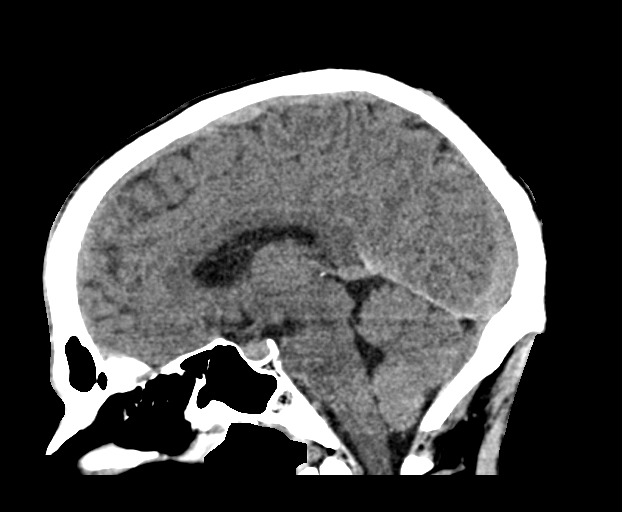
[im 53/80  brain]
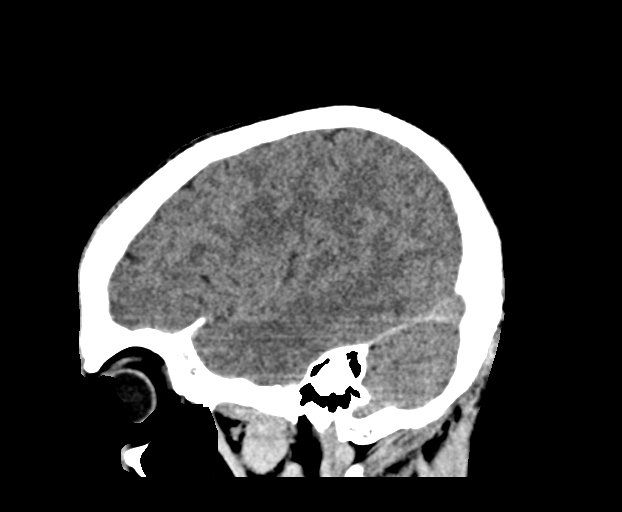

[16 of 47 positions shown; findings below may reference images not displayed]

FINDINGS: Brain: No evidence of acute infarction, hemorrhage, hydrocephalus,
extra-axial collection or mass lesion/mass effect.

Vascular: No hyperdense vessel or unexpected calcification.

Skull: Normal. Negative for fracture or focal lesion.

Sinuses/Orbits: No acute finding.

Other: None.
IMPRESSION: Normal head CT.
